# Patient Record
Sex: Female | Born: 1995 | Race: Black or African American | Hispanic: No | Marital: Single | Smoking: Never smoker
Health system: Southern US, Community
[De-identification: ages and names within clinical notes are randomized; demographics above are authoritative.]

## PROBLEM LIST (undated history)

## (undated) ENCOUNTER — Inpatient Hospital Stay (HOSPITAL_COMMUNITY): Payer: Self-pay

---
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
## 2020-07-24 ENCOUNTER — Encounter (HOSPITAL_BASED_OUTPATIENT_CLINIC_OR_DEPARTMENT_OTHER): Payer: Self-pay

## 2020-07-24 ENCOUNTER — Other Ambulatory Visit: Payer: Self-pay

## 2020-07-24 ENCOUNTER — Emergency Department (HOSPITAL_BASED_OUTPATIENT_CLINIC_OR_DEPARTMENT_OTHER)
Admission: EM | Admit: 2020-07-24 | Discharge: 2020-07-24 | Disposition: A | Discharge status: Home / Self Care | Payer: Self-pay | Attending: Emergency Medicine | Admitting: Emergency Medicine

## 2020-07-24 DIAGNOSIS — Z5321 Procedure and treatment not carried out due to patient leaving prior to being seen by health care provider: Secondary | ICD-10-CM | POA: Insufficient documentation

## 2020-07-24 DIAGNOSIS — S20112A Abrasion of breast, left breast, initial encounter: Secondary | ICD-10-CM | POA: Insufficient documentation

## 2020-07-24 DIAGNOSIS — W548XXA Other contact with dog, initial encounter: Secondary | ICD-10-CM | POA: Insufficient documentation

## 2020-07-24 NOTE — ED Triage Notes (Signed)
Pt states a dog at work approx 45 mins ago scratched her L nip[ple causing it to bleed. Pt states it was bleeding a large amount the bleeding is controlled at this time.

## 2021-11-17 ENCOUNTER — Ambulatory Visit (HOSPITAL_COMMUNITY)
Admission: EM | Admit: 2021-11-17 | Discharge: 2021-11-17 | Disposition: A | Discharge status: Home / Self Care | Payer: BC Managed Care – PPO | Attending: Internal Medicine | Admitting: Internal Medicine

## 2021-11-17 ENCOUNTER — Telehealth (HOSPITAL_COMMUNITY): Payer: Self-pay | Admitting: Emergency Medicine

## 2021-11-17 ENCOUNTER — Encounter (HOSPITAL_COMMUNITY): Payer: Self-pay | Admitting: Emergency Medicine

## 2021-11-17 DIAGNOSIS — J029 Acute pharyngitis, unspecified: Secondary | ICD-10-CM | POA: Diagnosis present

## 2021-11-17 DIAGNOSIS — K1120 Sialoadenitis, unspecified: Secondary | ICD-10-CM | POA: Diagnosis present

## 2021-11-17 LAB — POCT RAPID STREP A, ED / UC: Streptococcus, Group A Screen (Direct): NEGATIVE

## 2021-11-17 MED ORDER — KETOROLAC TROMETHAMINE 30 MG/ML IJ SOLN
INTRAMUSCULAR | Status: AC
  Filled 2021-11-17: qty 1

## 2021-11-17 MED ORDER — IBUPROFEN 600 MG PO TABS: 600.0000 mg | ORAL_TABLET | Freq: Four times a day (QID) | ORAL | 0 refills | Status: DC | PRN

## 2021-11-17 MED ORDER — IBUPROFEN 800 MG PO TABS: 800.0000 mg | ORAL_TABLET | Freq: Once | ORAL | Status: DC

## 2021-11-17 MED ORDER — KETOROLAC TROMETHAMINE 30 MG/ML IJ SOLN
30.0000 mg | Freq: Once | INTRAMUSCULAR | Status: AC
  Administered 2021-11-17: 30 mg via INTRAMUSCULAR

## 2021-11-17 MED ORDER — ACETAMINOPHEN 500 MG PO TABS: 500.0000 mg | ORAL_TABLET | Freq: Four times a day (QID) | ORAL | 0 refills | Status: AC | PRN

## 2021-11-17 NOTE — Discharge Instructions (Addendum)
Your strep test was negative today.  I suspect your symptoms are caused by salivary gland inflammation and possible obstruction.  I doubt that there is an infection to your salivary gland at this time.  If your left side of your face becomes more swollen, red, and hot, please return to urgent care.  ? ?Take Tylenol and ibuprofen every 6 hours at home for pain, fever, and inflammation.  Suck on sour candy to increase salivation as this will help to relieve any blockage that is present to your gland that is causing the swelling.  Continue to apply warm and cold compresses as needed for pain relief and to decrease swelling. ? ?We will call you if the throat culture we sent off today grows any bacteria.  If it is negative, you will not receive a phone call from [place]. ? ?If you develop any new or worsening symptoms or do not improve in the next 2 to 3 days, please return.  If your symptoms are severe, please go to the emergency room.  Follow-up with your primary care provider for further evaluation and management of your symptoms as well as ongoing wellness visits.  I hope you feel better! ?

## 2021-11-17 NOTE — ED Provider Notes (Signed)
?[facility] ? ? ? ?CSN: [id number] ?Arrival date & time: 11/17/21  0802 ? ? ?  ? ?History   ?Chief Complaint ?Chief Complaint  ?Patient presents with  ? Sore Throat  ? ? ?HPI ?Regina Cline is a 26 y.o. female.  ? ?Patient presents to urgent care for evaluation of her sore throat, difficulty and painful swallowing, and swollen lymph nodes to her left side of her neck for the last 2 days.  Symptoms progressively worsened and she currently rates her throat pain at a "20" on a scale of 0-10.  Throat pain radiates to her left ear.  She also reports pain to her oral mucosa and states that it is difficult due to pain.  She denies nasal congestion, vomiting, dizziness, and diarrhea.  Denies sick contacts.  She does report a small amount of abdominal pain that is localized to her upper quadrants and nausea associated with chills.  She states that she gets abdominal pain when she becomes "hot".  She took Tylenol this morning at 4 AM and is currently afebrile, but reports chills at home.  She denies urinary frequency, urgency, and burning.  She also denies vaginal discharge and odor.  She does not remember the last time she had a bowel movement and states that it was over 2 days ago.  Reports numbness to her left side of her face that started at the same time as her sore throat and difficulty swallowing that has become progressively worse.  Denies unilateral weakness, headache, and vision changes.  She has attempted to apply warm compresses and ice to her left side of her face with temporary relief.  Denies any other aggravating or relieving factors.  Denies recent sickness and antibiotic use.  States she has never had this pain before.  ? ? ?Sore Throat ? ? ?History reviewed. No pertinent past medical history. ? ?There are no problems to display for this patient. ? ? ?History reviewed. No pertinent surgical history. ? ?OB History   ?No obstetric history on file. ?  ? ? ? ?Home Medications   ? ?Prior to Admission  medications   ?Medication Sig Start Date End Date Taking? Authorizing Provider  ?acetaminophen (TYLENOL) 500 MG tablet Take 1 tablet (500 mg total) by mouth every 6 (six) hours as needed. 11/17/21  Yes Carlisle Beers, FNP  ?ibuprofen (ADVIL) 600 MG tablet Take 1 tablet (600 mg total) by mouth every 6 (six) hours as needed. 11/17/21  Yes Carlisle Beers, FNP  ? ? ?Family History ?No family history on file. ? ?Social History ?Social History  ? ?Tobacco Use  ? Smoking status: Never  ?Substance Use Topics  ? Alcohol use: No  ? Drug use: Yes  ?  Types: Marijuana  ? ? ? ?Allergies   ?Patient has no known allergies. ? ? ?Review of Systems ?Review of Systems ?Per HPI ? ?Physical Exam ?Triage Vital Signs ?ED Triage Vitals  ?Enc Vitals Group  ?   BP 11/17/21 0818 (!) 152/96  ?   Pulse Rate 11/17/21 0818 88  ?   Resp 11/17/21 0818 15  ?   Temp 11/17/21 0818 98.8 ?F (37.1 ?C)  ?   Temp Source 11/17/21 0818 Oral  ?   SpO2 11/17/21 0818 100 %  ?   Weight --   ?   Height --   ?   Head Circumference --   ?   Peak Flow --   ?   Pain Score 11/17/21 0816  10  ?   Pain Loc --   ?   Pain Edu? --   ?   Excl. in GC? --   ? ?No data found. ? ?Updated Vital Signs ?BP (!) 152/96 (BP Location: Left Arm)   Pulse 88   Temp 98.8 ?F (37.1 ?C) (Oral)   Resp 15   SpO2 100%  ? ?Visual Acuity ?Right Eye Distance:   ?Left Eye Distance:   ?Bilateral Distance:   ? ?Right Eye Near:   ?Left Eye Near:    ?Bilateral Near:    ? ?Physical Exam ?Vitals and nursing note reviewed.  ?Constitutional:   ?   General: She is not in acute distress. ?   Appearance: Normal appearance. She is well-developed. She is not ill-appearing or toxic-appearing.  ?   Comments: Very pleasant patient seated in a comfortable position on the exam table in no acute distress.  ?HENT:  ?   Head: Normocephalic and atraumatic.  ?   Jaw: There is normal jaw occlusion. Pain on movement present.  ?   Salivary Glands: Left salivary gland is diffusely enlarged and tender.  ?    Comments: Mild swelling to left side of jaw. ?   Right Ear: Tympanic membrane, ear canal and external ear normal. Tympanic membrane is not erythematous.  ?   Left Ear: Tympanic membrane, ear canal and external ear normal. Tympanic membrane is not erythematous.  ?   Nose: Nose normal. No congestion or rhinorrhea.  ?   Mouth/Throat:  ?   Mouth: Mucous membranes are moist. No oral lesions.  ?   Pharynx: Uvula midline. Posterior oropharyngeal erythema present. No oropharyngeal exudate or uvula swelling.  ?   Tonsils: No tonsillar exudate or tonsillar abscesses. 1+ on the right. 2+ on the left.  ?   Comments: No dental abscess, ulceration, lesions, or drainage visualized.  Patient is very tender to exam of her oropharynx.  Erythema present to posterior oropharynx. ?Eyes:  ?   Extraocular Movements: Extraocular movements intact.  ?   Conjunctiva/sclera: Conjunctivae normal.  ?   Pupils: Pupils are equal, round, and reactive to light.  ?Neck:  ?   Comments: Left sided cervical lymphadenopathy present.  Submandibular lymphadenopathy present.  No preauricular or postauricular lymphadenopathy present to physical exam. ?Cardiovascular:  ?   Rate and Rhythm: Normal rate and regular rhythm.  ?   Heart sounds: Normal heart sounds. No murmur heard. ?  No friction rub. No gallop.  ?Pulmonary:  ?   Effort: Pulmonary effort is normal. No respiratory distress.  ?   Breath sounds: Normal breath sounds. No decreased breath sounds, wheezing, rhonchi or rales.  ?Chest:  ?   Chest wall: No tenderness.  ?Abdominal:  ?   General: Abdomen is flat. Bowel sounds are increased. There is no distension. There are no signs of injury.  ?   Palpations: Abdomen is soft.  ?   Tenderness: There is no abdominal tenderness. There is no right CVA tenderness, left CVA tenderness or guarding. Negative signs include McBurney's sign.  ?Musculoskeletal:     ?   General: No swelling.  ?   Cervical back: Normal range of motion and neck supple.  ?Lymphadenopathy:   ?   Cervical: Cervical adenopathy present.  ?Skin: ?   General: Skin is warm and dry.  ?   Capillary Refill: Capillary refill takes less than 2 seconds.  ?   Findings: No rash.  ?Neurological:  ?   General: No focal deficit  present.  ?   Mental Status: She is alert and oriented to person, place, and time.  ?Psychiatric:     ?   Mood and Affect: Mood normal.     ?   Behavior: Behavior normal.     ?   Thought Content: Thought content normal.     ?   Judgment: Judgment normal.  ? ? ? ?UC Treatments / Results  ?Labs ?(all labs ordered are listed, but only abnormal results are displayed) ?Labs Reviewed  ?CULTURE, GROUP A STREP [facility]  ?POCT RAPID STREP A, ED / UC  ? ? ?EKG ? ? ?Radiology ?No results found. ? ?Procedures ?Procedures (including critical care time) ? ?Medications Ordered in UC ?Medications  ?ketorolac (TORADOL) 30 MG/ML injection 30 mg (30 mg Intramuscular Given 11/17/21 0835)  ? ? ?Initial Impression / Assessment and Plan / UC Course  ?I have reviewed the triage vital signs and the nursing notes. ? ?Pertinent labs & imaging results that were available during my care of the patient were reviewed by me and considered in my medical decision making (see chart for details). ? ?Patient is a 26 year old female presenting to urgent care with sore throat, painful swallowing, and left-sided facial swelling and numbness for the last 2 days.  Posterior oropharynx is erythematous without drainage.  Oral mucosa is moist and tender to palpation.  Significant swelling and lymphadenopathy noted to submandibular lymph nodes and mild lymphadenopathy to cervical lymph nodes.  Patient is afebrile in clinic, but did take antipyretic at home 4 hours prior to arriving at urgent care.  Significant subjective history of chills indicating she has been febrile at home.  Suspect symptoms may be caused by an obstructed salivary gland, but obtaining strep throat swab today to rule out bacterial cause of patient's symptoms.  Throat  culture sent and pending.  Doubt mononucleosis at this time due to abrupt onset of symptoms.  Patient has normal dentition and no peritonsillar abscesses visualized.  ? ?Group A strep test negative today in clinic.  Pl

## 2021-11-17 NOTE — ED Triage Notes (Signed)
Pt c/o pain with talking or swallowing and left neck lymph node swelling that radiates to left ear x 3 days.  ?

## 2021-11-19 LAB — CULTURE, GROUP A STREP (THRC)

## 2024-07-29 ENCOUNTER — Inpatient Hospital Stay (HOSPITAL_COMMUNITY)

## 2024-07-29 ENCOUNTER — Inpatient Hospital Stay (HOSPITAL_COMMUNITY)
Admission: AD | Admit: 2024-07-29 | Discharge: 2024-07-30 | Disposition: A | Attending: Obstetrics and Gynecology | Admitting: Obstetrics and Gynecology

## 2024-07-29 ENCOUNTER — Encounter (HOSPITAL_COMMUNITY): Payer: Self-pay | Admitting: Obstetrics and Gynecology

## 2024-07-29 DIAGNOSIS — O23591 Infection of other part of genital tract in pregnancy, first trimester: Secondary | ICD-10-CM | POA: Insufficient documentation

## 2024-07-29 DIAGNOSIS — O209 Hemorrhage in early pregnancy, unspecified: Secondary | ICD-10-CM | POA: Insufficient documentation

## 2024-07-29 DIAGNOSIS — D259 Leiomyoma of uterus, unspecified: Secondary | ICD-10-CM | POA: Diagnosis not present

## 2024-07-29 DIAGNOSIS — Z3491 Encounter for supervision of normal pregnancy, unspecified, first trimester: Secondary | ICD-10-CM

## 2024-07-29 DIAGNOSIS — O10911 Unspecified pre-existing hypertension complicating pregnancy, first trimester: Secondary | ICD-10-CM | POA: Diagnosis not present

## 2024-07-29 DIAGNOSIS — N760 Acute vaginitis: Secondary | ICD-10-CM | POA: Diagnosis not present

## 2024-07-29 DIAGNOSIS — O26891 Other specified pregnancy related conditions, first trimester: Secondary | ICD-10-CM | POA: Insufficient documentation

## 2024-07-29 DIAGNOSIS — I10 Essential (primary) hypertension: Secondary | ICD-10-CM

## 2024-07-29 DIAGNOSIS — D251 Intramural leiomyoma of uterus: Secondary | ICD-10-CM | POA: Insufficient documentation

## 2024-07-29 DIAGNOSIS — O99891 Other specified diseases and conditions complicating pregnancy: Secondary | ICD-10-CM | POA: Diagnosis not present

## 2024-07-29 DIAGNOSIS — O3411 Maternal care for benign tumor of corpus uteri, first trimester: Secondary | ICD-10-CM | POA: Insufficient documentation

## 2024-07-29 DIAGNOSIS — M549 Dorsalgia, unspecified: Secondary | ICD-10-CM | POA: Insufficient documentation

## 2024-07-29 DIAGNOSIS — B9689 Other specified bacterial agents as the cause of diseases classified elsewhere: Secondary | ICD-10-CM | POA: Insufficient documentation

## 2024-07-29 DIAGNOSIS — Z3A01 Less than 8 weeks gestation of pregnancy: Secondary | ICD-10-CM | POA: Insufficient documentation

## 2024-07-29 LAB — URINALYSIS, ROUTINE W REFLEX MICROSCOPIC
Bilirubin Urine: NEGATIVE
Glucose, UA: NEGATIVE mg/dL
Hgb urine dipstick: NEGATIVE
Ketones, ur: NEGATIVE mg/dL
Leukocytes,Ua: NEGATIVE
Nitrite: NEGATIVE
Protein, ur: NEGATIVE mg/dL
Specific Gravity, Urine: 1.023 (ref 1.005–1.030)
pH: 5 (ref 5.0–8.0)

## 2024-07-29 LAB — WET PREP, GENITAL
Sperm: NONE SEEN
Trich, Wet Prep: NONE SEEN
WBC, Wet Prep HPF POC: <10
Yeast Wet Prep HPF POC: NONE SEEN

## 2024-07-29 LAB — CBC
HCT: 42.4 % (ref 36.0–46.0)
Hemoglobin: 14.1 g/dL (ref 12.0–15.0)
MCH: 28.4 pg (ref 26.0–34.0)
MCHC: 33.3 g/dL (ref 30.0–36.0)
MCV: 85.5 fL (ref 80.0–100.0)
Platelets: 273 K/uL (ref 150–400)
RBC: 4.96 MIL/uL (ref 3.87–5.11)
RDW: 13.3 % (ref 11.5–15.5)
WBC: 8 K/uL (ref 4.0–10.5)
nRBC: 0 % (ref 0.0–0.2)

## 2024-07-29 LAB — HCG, QUANTITATIVE, PREGNANCY: hCG, Beta Chain, Quant, S: 56236 m[IU]/mL — ABNORMAL HIGH

## 2024-07-29 LAB — ABO/RH: ABO/RH(D): B POS

## 2024-07-29 NOTE — [facility] Note (Signed)
..  Douglas York is a 29 y.o. at [redacted]w[redacted]d here in [facility] reporting: vaginal bleeding that began last night at 2300 and is period-like and abodminal cramping that started when she found out she was pregnant and is mostly on left side and travels to her back.  Has not taken anything for the pain.  LMP: 06/07/2025 Pain score: 3/10 Has known elevated blood pressure, does not take any medication for it.  Vitals:   07/29/24 2149  BP: (!) 147/89  Pulse: (!) 56  Resp: 17  Temp: 98.2 F (36.8 C)  SpO2: 100%     FHT:n/a Lab orders placed from triage: UA

## 2024-07-30 DIAGNOSIS — B9689 Other specified bacterial agents as the cause of diseases classified elsewhere: Secondary | ICD-10-CM

## 2024-07-30 DIAGNOSIS — O99891 Other specified diseases and conditions complicating pregnancy: Secondary | ICD-10-CM

## 2024-07-30 DIAGNOSIS — Z3A01 Less than 8 weeks gestation of pregnancy: Secondary | ICD-10-CM | POA: Diagnosis not present

## 2024-07-30 DIAGNOSIS — M549 Dorsalgia, unspecified: Secondary | ICD-10-CM

## 2024-07-30 DIAGNOSIS — N760 Acute vaginitis: Secondary | ICD-10-CM

## 2024-07-30 DIAGNOSIS — D259 Leiomyoma of uterus, unspecified: Secondary | ICD-10-CM

## 2024-07-30 DIAGNOSIS — I10 Essential (primary) hypertension: Secondary | ICD-10-CM

## 2024-07-30 LAB — SYPHILIS: RPR W/REFLEX TO RPR TITER AND TREPONEMAL ANTIBODIES, TRADITIONAL SCREENING AND DIAGNOSIS ALGORITHM: RPR Ser Ql: NONREACTIVE

## 2024-07-30 MED ORDER — METRONIDAZOLE 500 MG PO TABS: 500.0000 mg | ORAL_TABLET | Freq: Two times a day (BID) | ORAL | 0 refills | Status: AC

## 2024-07-30 NOTE — Discharge Instructions (Signed)

## 2024-07-30 NOTE — MAU Provider Note (Signed)
 " CC/ Vaginal bleeding    S/HPI Ms. Regina Cline is a 29 y.o. G1P0 patient who presents to MAU today with complaint of reporting [redacted]w[redacted]d with c/o VB that began at 2300 which is period like with abdominal cramping. Patient hs not taken anything for pain. Denies any GI/GU issues and offers no vaginal itching /burning or irration.Ptient reports she has a h/o HTN but does not take any medication   LMP 06/07/25  Un established PNC   OBJECTIVE BP 137/75 (BP Location: Right Arm)   Pulse (!) 56   Temp 98.2 F (36.8 C) (Oral)   Resp 17   Ht 5' 3 (1.6 m)   Wt 60.8 kg   LMP 06/07/2024   SpO2 100%   BMI 23.74 kg/m  Physical Exam Vitals and nursing note reviewed.  Constitutional:      General: She is not in acute distress.    Appearance: Normal appearance. She is not ill-appearing.  HENT:     Nose: Nose normal.     Mouth/Throat:     Mouth: Mucous membranes are moist.  Cardiovascular:     Rate and Rhythm: Normal rate.  Abdominal:     General: There is no distension.     Palpations: Abdomen is soft.     Tenderness: There is no abdominal tenderness. There is no guarding.  Musculoskeletal:        General: Normal range of motion.     Cervical back: Normal range of motion.  Skin:    General: Skin is warm.  Neurological:     Mental Status: She is alert and oriented to person, place, and time.  Psychiatric:        Mood and Affect: Mood normal.        Behavior: Behavior normal.     MDM   HIGH  Vaginal bleeding/ abdominal pain  in early pregnacy CBC HCG Quant ABO OB Ultrasound Vaginal Swabs UA   Differential diagnosis considered for 1st trimester vaginal bleeding includes but is not limited to: ectopic pregnancy, complete spontaneous abortion, incomplete abortion, missed abortion, threatened abortion, embryonic/fetal demise, cervical insufficiency, cervical or vaginal disorder    Orders Placed This Encounter  Procedures   Wet prep, genital    Standing Status:   Standing     Number of Occurrences:   1   US  OB LESS THAN 14 WEEKS WITH OB TRANSVAGINAL    Standing Status:   Standing    Number of Occurrences:   1    Symptom/Reason for Exam:   Vaginal bleeding [[id number]]   Urinalysis, Routine w reflex microscopic -Urine, Clean Catch    Standing Status:   Standing    Number of Occurrences:   1    Specimen Source:   Urine, Clean Catch [76]   CBC    Standing Status:   Standing    Number of Occurrences:   1   hCG, quantitative, pregnancy    Standing Status:   Standing    Number of Occurrences:   1   RPR W/RFLX TO RPR TITER, TREPONEMAL AB, SCREEN AND DIAGNOSIS    Standing Status:   Standing    Number of Occurrences:   1   ABO/Rh    Standing Status:   Standing    Number of Occurrences:   1   Discharge patient Discharge disposition: 01-Home or Self Care; Discharge patient date: 07/30/2024    Standing Status:   Standing    Number of Occurrences:   1  Discharge disposition:   01-Home or Self Care [1]    Discharge patient date:   07/30/2024      Results for orders placed or performed during the hospital encounter of 07/29/24 (from the past 24 hours)  Urinalysis, Routine w reflex microscopic -Urine, Clean Catch     Status: Abnormal   Collection Time: 07/29/24  9:23 PM  Result Value Ref Range   Color, Urine YELLOW YELLOW   APPearance HAZY (A) CLEAR   Specific Gravity, Urine 1.023 1.005 - 1.030   pH 5.0 5.0 - 8.0   Glucose, UA NEGATIVE NEGATIVE mg/dL   Hgb urine dipstick NEGATIVE NEGATIVE   Bilirubin Urine NEGATIVE NEGATIVE   Ketones, ur NEGATIVE NEGATIVE mg/dL   Protein, ur NEGATIVE NEGATIVE mg/dL   Nitrite NEGATIVE NEGATIVE   Leukocytes,Ua NEGATIVE NEGATIVE  CBC     Status: None   Collection Time: 07/29/24 10:14 PM  Result Value Ref Range   WBC 8.0 4.0 - 10.5 K/uL   RBC 4.96 3.87 - 5.11 MIL/uL   Hemoglobin 14.1 12.0 - 15.0 g/dL   HCT 57.5 63.9 - 53.9 %   MCV 85.5 80.0 - 100.0 fL   MCH 28.4 26.0 - 34.0 pg   MCHC 33.3 30.0 - 36.0 g/dL   RDW 86.6 88.4  - 84.4 %   Platelets 273 150 - 400 K/uL   nRBC 0.0 0.0 - 0.2 %  ABO/Rh     Status: None   Collection Time: 07/29/24 10:14 PM  Result Value Ref Range   ABO/RH(D) B POS    No rh immune globuloin      NOT A RH IMMUNE GLOBULIN CANDIDATE, PT RH POSITIVE Performed at [facility], [address], [place], [place] [address]   hCG, quantitative, pregnancy     Status: Abnormal   Collection Time: 07/29/24 10:14 PM  Result Value Ref Range   hCG, Beta Chain, Quant, S 56,236 (H) <5 mIU/mL  RPR W/RFLX TO RPR TITER, TREPONEMAL AB, SCREEN AND DIAGNOSIS     Status: None   Collection Time: 07/29/24 10:14 PM  Result Value Ref Range   RPR Ser Ql NON REACTIVE NON REACTIVE  Wet prep, genital     Status: Abnormal   Collection Time: 07/29/24 10:21 PM  Result Value Ref Range   Yeast Wet Prep HPF POC NONE SEEN NONE SEEN   Trich, Wet Prep NONE SEEN NONE SEEN   Clue Cells Wet Prep HPF POC PRESENT (A) NONE SEEN   WBC, Wet Prep HPF POC <10 <10   Sperm NONE SEEN      Study Result  Narrative & Impression  EXAM: OBSTETRIC ULTRASOUND FIRST TRIMESTER   TECHNIQUE: Transvaginal first trimester obstetric pelvic duplex ultrasound was performed with real-time imaging, color flow Doppler imaging, and spectral analysis.   COMPARISON: None available.   CLINICAL HISTORY: Vaginal bleeding.   FINDINGS:   UTERUS: Anterior intramural uterine fibroid is present measuring 1.4 x 1.2 x 1.4 cm.   GESTATIONAL SAC(S): Single normal appearing intrauterine gestational sac. No subchorionic hemorrhage.   YOLK SAC: Present. Measures 2.6 mm.   EMBRYO(<11WK) /FETUS(>=11WK): Single.   CROWN RUMP LENGTH: Measures 13.0 mm.   RATE OF CARDIAC ACTIVITY: 151 bpm.   RIGHT OVARY: Measures 4.6 x 2.7 x 3.2 cm.   LEFT OVARY: Measures 3.2 x 1.7 x 1.3 cm.   FREE FLUID: No free fluid.   MEASUREMENTS   ESTIMATED GESTATIONAL AGE BY CURRENT ULTRASOUND: 7 weeks 4 days   ESTIMATED DUE DATE:  03/13/2025    IMPRESSION: 1. Single live intrauterine pregnancy with cardiac activity at 151 bpm, dating 7 weeks 4 days by crown-rump length. 2. Anterior intramural uterine fibroid measuring 1.4 x 1.2 x 1.4 cm.   Electronically signed by: Greig Pique MD 07/29/2024 11:21 PM EST RP Workstation: [id number]      I have reviewed the patient chart and performed the physical exam . I have ordered & interpreted the lab results and reviewed and independently  interpreted the OB Ultrasound images and agree with the radiologist findings   ASSESSMENT/PLAN Medical screening exam complete  Elevated blood pressure reading in office with diagnosis of hypertension BP 147/89 Patient known cHTN No medication currently and declines medication at this time  Prefers holistic options   Bacterial vaginosis RX- Flagyl  500 BID x 7 with S/R/B  Normal intrauterine pregnancy on prenatal ultrasound in first trimester  IUP with [facility]  Uterine leiomyoma, unspecified location Seen on ultrasound findings  Plan to have OB f/u with additional sonograms in pregnancy  Back pain affecting pregnancy   PLAN  Establish [facility]  Discharge from MAU in stable condition  See AVS for full description of educational information and instructions provided to the patient at time of discharge  List of options for follow-up given   Warning signs for worsening condition that would warrant emergency follow-up discussed  Patient may return to MAU as needed  ---------------------------------------------------------------------------------------- Olam Dalton, MSN, [facility], [facility] for [facility]  "

## 2024-07-31 LAB — GC/CHLAMYDIA PROBE AMP (~~LOC~~) NOT AT ARMC
Chlamydia: NEGATIVE
Comment: NEGATIVE
Comment: NORMAL
Neisseria Gonorrhea: NEGATIVE
